# Patient Record
Sex: Female | Born: 1975 | Race: White | Hispanic: No | Marital: Single | Smoking: Former smoker
Health system: Southern US, Community
[De-identification: ages and names within clinical notes are randomized; demographics above are authoritative.]

## PROBLEM LIST (undated history)

## (undated) DIAGNOSIS — F32A Depression, unspecified: Secondary | ICD-10-CM

## (undated) DIAGNOSIS — F419 Anxiety disorder, unspecified: Secondary | ICD-10-CM

## (undated) DIAGNOSIS — E7800 Pure hypercholesterolemia, unspecified: Secondary | ICD-10-CM

## (undated) DIAGNOSIS — F988 Other specified behavioral and emotional disorders with onset usually occurring in childhood and adolescence: Secondary | ICD-10-CM

## (undated) DIAGNOSIS — F329 Major depressive disorder, single episode, unspecified: Secondary | ICD-10-CM

## (undated) DIAGNOSIS — J329 Chronic sinusitis, unspecified: Secondary | ICD-10-CM

## (undated) HISTORY — PX: SINUS SURGERY WITH INSTATRAK: SHX5215

## (undated) HISTORY — PX: HIP SURGERY: SHX245

---

## 2004-12-24 ENCOUNTER — Ambulatory Visit (HOSPITAL_COMMUNITY): Admission: RE | Admit: 2004-12-24 | Discharge: 2004-12-24 | Payer: Self-pay | Admitting: Obstetrics and Gynecology

## 2013-06-07 ENCOUNTER — Encounter: Payer: Self-pay | Admitting: Family Medicine

## 2016-06-10 ENCOUNTER — Encounter (HOSPITAL_BASED_OUTPATIENT_CLINIC_OR_DEPARTMENT_OTHER): Payer: Self-pay

## 2016-06-10 ENCOUNTER — Emergency Department (HOSPITAL_BASED_OUTPATIENT_CLINIC_OR_DEPARTMENT_OTHER): Payer: Worker's Compensation

## 2016-06-10 ENCOUNTER — Emergency Department (HOSPITAL_BASED_OUTPATIENT_CLINIC_OR_DEPARTMENT_OTHER)
Admission: EM | Admit: 2016-06-10 | Discharge: 2016-06-10 | Disposition: A | Discharge status: Home / Self Care | Payer: Worker's Compensation | Attending: Emergency Medicine | Admitting: Emergency Medicine

## 2016-06-10 DIAGNOSIS — S4992XA Unspecified injury of left shoulder and upper arm, initial encounter: Secondary | ICD-10-CM | POA: Diagnosis not present

## 2016-06-10 DIAGNOSIS — Y939 Activity, unspecified: Secondary | ICD-10-CM | POA: Insufficient documentation

## 2016-06-10 DIAGNOSIS — Y92410 Unspecified street and highway as the place of occurrence of the external cause: Secondary | ICD-10-CM | POA: Diagnosis not present

## 2016-06-10 DIAGNOSIS — F909 Attention-deficit hyperactivity disorder, unspecified type: Secondary | ICD-10-CM | POA: Diagnosis not present

## 2016-06-10 DIAGNOSIS — F17200 Nicotine dependence, unspecified, uncomplicated: Secondary | ICD-10-CM | POA: Diagnosis not present

## 2016-06-10 DIAGNOSIS — Y999 Unspecified external cause status: Secondary | ICD-10-CM | POA: Insufficient documentation

## 2016-06-10 DIAGNOSIS — S39012A Strain of muscle, fascia and tendon of lower back, initial encounter: Secondary | ICD-10-CM | POA: Diagnosis not present

## 2016-06-10 DIAGNOSIS — Z79899 Other long term (current) drug therapy: Secondary | ICD-10-CM | POA: Diagnosis not present

## 2016-06-10 DIAGNOSIS — S3992XA Unspecified injury of lower back, initial encounter: Secondary | ICD-10-CM | POA: Diagnosis present

## 2016-06-10 HISTORY — DX: Chronic sinusitis, unspecified: J32.9

## 2016-06-10 HISTORY — DX: Major depressive disorder, single episode, unspecified: F32.9

## 2016-06-10 HISTORY — DX: Other specified behavioral and emotional disorders with onset usually occurring in childhood and adolescence: F98.8

## 2016-06-10 HISTORY — DX: Anxiety disorder, unspecified: F41.9

## 2016-06-10 HISTORY — DX: Depression, unspecified: F32.A

## 2016-06-10 MED ORDER — IBUPROFEN 800 MG PO TABS: 800.0000 mg | ORAL_TABLET | Freq: Three times a day (TID) | ORAL | 0 refills | Status: AC | PRN
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
## 2016-06-10 NOTE — ED Notes (Signed)
Returned from xray
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
## 2016-06-10 NOTE — ED Notes (Signed)
PA at bedside.

## 2016-06-10 NOTE — ED Notes (Signed)
Patient transported to X-ray 

## 2016-06-10 NOTE — Discharge Instructions (Signed)
Return here as needed. Follow up with the orthopedist provided. Ice and heat to the areas that are sore.

## 2017-07-13 ENCOUNTER — Encounter (HOSPITAL_COMMUNITY): Payer: Self-pay | Admitting: Emergency Medicine

## 2017-07-13 ENCOUNTER — Emergency Department (HOSPITAL_COMMUNITY): Payer: 59

## 2017-07-13 ENCOUNTER — Other Ambulatory Visit: Payer: Self-pay

## 2017-07-13 ENCOUNTER — Emergency Department (HOSPITAL_COMMUNITY)
Admission: EM | Admit: 2017-07-13 | Discharge: 2017-07-13 | Disposition: A | Discharge status: Home / Self Care | Payer: 59 | Attending: Emergency Medicine | Admitting: Emergency Medicine

## 2017-07-13 DIAGNOSIS — Z79899 Other long term (current) drug therapy: Secondary | ICD-10-CM | POA: Insufficient documentation

## 2017-07-13 DIAGNOSIS — R079 Chest pain, unspecified: Secondary | ICD-10-CM | POA: Insufficient documentation

## 2017-07-13 DIAGNOSIS — R945 Abnormal results of liver function studies: Secondary | ICD-10-CM | POA: Diagnosis not present

## 2017-07-13 DIAGNOSIS — R7989 Other specified abnormal findings of blood chemistry: Secondary | ICD-10-CM

## 2017-07-13 DIAGNOSIS — Z87891 Personal history of nicotine dependence: Secondary | ICD-10-CM | POA: Insufficient documentation

## 2017-07-13 LAB — COMPREHENSIVE METABOLIC PANEL
ALT: 145 U/L — ABNORMAL HIGH (ref 14–54)
ANION GAP: 7 (ref 5–15)
AST: 279 U/L — AB (ref 15–41)
Albumin: 3.6 g/dL (ref 3.5–5.0)
Alkaline Phosphatase: 91 U/L (ref 38–126)
BILIRUBIN TOTAL: 0.5 mg/dL (ref 0.3–1.2)
BUN: 13 mg/dL (ref 6–20)
CHLORIDE: 101 mmol/L (ref 101–111)
CO2: 25 mmol/L (ref 22–32)
Calcium: 9 mg/dL (ref 8.9–10.3)
Creatinine, Ser: 0.95 mg/dL (ref 0.44–1.00)
Glucose, Bld: 121 mg/dL — ABNORMAL HIGH (ref 65–99)
POTASSIUM: 4.2 mmol/L (ref 3.5–5.1)
Sodium: 133 mmol/L — ABNORMAL LOW (ref 135–145)
TOTAL PROTEIN: 6.7 g/dL (ref 6.5–8.1)

## 2017-07-13 LAB — D-DIMER, QUANTITATIVE (NOT AT ARMC): D DIMER QUANT: 0.47 ug{FEU}/mL (ref 0.00–0.50)

## 2017-07-13 LAB — LIPASE, BLOOD: LIPASE: 37 U/L (ref 11–51)

## 2017-07-13 LAB — CBC
HCT: 39.2 % (ref 36.0–46.0)
Hemoglobin: 13.3 g/dL (ref 12.0–15.0)
MCH: 30.4 pg (ref 26.0–34.0)
MCHC: 33.9 g/dL (ref 30.0–36.0)
MCV: 89.5 fL (ref 78.0–100.0)
Platelets: 225 10*3/uL (ref 150–400)
RBC: 4.38 MIL/uL (ref 3.87–5.11)
RDW: 12.6 % (ref 11.5–15.5)
WBC: 13.9 10*3/uL — ABNORMAL HIGH (ref 4.0–10.5)

## 2017-07-13 LAB — I-STAT TROPONIN, ED: Troponin i, poc: 0 ng/mL (ref 0.00–0.08)

## 2017-07-13 MED ORDER — PANTOPRAZOLE SODIUM 20 MG PO TBEC: 20.0000 mg | DELAYED_RELEASE_TABLET | Freq: Every day | ORAL | 0 refills | Status: DC

## 2017-07-13 MED ORDER — SODIUM CHLORIDE 0.9 % IV BOLUS (SEPSIS)
1000.0000 mL | Freq: Once | INTRAVENOUS | Status: AC
  Administered 2017-07-13: 1000 mL via INTRAVENOUS

## 2017-07-13 MED ORDER — KETOROLAC TROMETHAMINE 30 MG/ML IJ SOLN
30.0000 mg | Freq: Once | INTRAMUSCULAR | Status: AC
  Administered 2017-07-13: 30 mg via INTRAVENOUS
  Filled 2017-07-13: qty 1

## 2017-07-13 NOTE — ED Triage Notes (Addendum)
Per EMS:  Pt here with c/o of sudden onset CP 8/10.  Pressure radiating to back and abdomen. EMS administered 324 ASA, 4mg  Zofran, and 2-Nitro.  Pain currently is 4/10.  Pt also complains of nausea and pain when trying to take a deep breath.  PTA vitals: 154/98 BP, HR 108,  SP02 98% RA.

## 2017-07-13 NOTE — ED Provider Notes (Signed)
[facility] Provider Note   CSN: [id number] Arrival date & time:        History   Chief Complaint Chief Complaint  Patient presents with  . Chest Pain    HPI Charlotte Frost is a 41 y.o. female.  HPI  41 y.o. female with a hx of Anxiety, Depression, presents to the Emergency Department today due to chest pain. This occurred around 1830. 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 States pain was 10/10 and lasted 20 minutes approximately. Notes residual dullness, but not near as pad. Rates pain 2/10. Pt was given 324 ASA, Zofran and 2 Nitro PTA. Notes some relief with nitro, but pain returned right away. Notes diaphoresis as well nausea. No emesis. No shortness of breath. Notes abdominal discomfort in epigastric region. No headaches. No fever. No cough/congestion. No hx ACS. No FH. No hx DVT/PE. No recent travel. No recent surgeries. No other symptoms noted    Past Medical History:  Diagnosis Date  . ADD (attention deficit disorder)   . Anxiety   . Depression   . Sinusitis, chronic     There are no active problems to display for this patient.   Past Surgical History:  Procedure Laterality Date  . HIP SURGERY    . SINUS SURGERY WITH INSTATRAK      OB History    No data available       Home Medications    Prior to Admission medications   Medication Sig Start Date End Date Taking? Authorizing Provider  Cetirizine HCl (ZYRTEC PO) Take by mouth.    [provider]  ClonazePAM (KLONOPIN PO) Take by mouth.    [provider]  fluticasone (FLONASE) 50 MCG/ACT nasal spray Place into both nostrils daily.    [provider]  ibuprofen (ADVIL,MOTRIN) 800 MG tablet Take 1 tablet (800 mg total) by mouth every 8 (eight) hours as needed. 06/10/16   Lawyer, Cristal Deerhristopher, PA-C  Lisdexamfetamine Dimesylate (VYVANSE PO) Take  by mouth.    [provider]  Montelukast Sodium (SINGULAIR PO) Take by mouth.    [provider]  Multiple Vitamin (MULTIVITAMIN) tablet Take 1 tablet by mouth daily.    [provider]  Pseudoephedrine HCl (SUDAFED PO) Take by mouth.    [provider]  Sertraline HCl (ZOLOFT PO) Take by mouth.    [provider]    Family History History reviewed. No pertinent family history.  Social History Social History   Tobacco Use  . Smoking status: Former Smoker    Last attempt to quit: 07/13/2004    Years since quitting: 13.0  . Smokeless tobacco: Never Used  Substance Use Topics  . Alcohol use: Yes    Comment: weekly  . Drug use: No     Allergies   Adhesive [tape]; Neosporin [neomycin-bacitracin zn-polymyx]; and Penicillins   Review of Systems Review of Systems ROS reviewed and all are negative for acute change except as noted in the HPI.  Physical Exam Updated Vital Signs BP 128/79   Pulse (!) 104   Resp 13   LMP 06/12/2017 (Approximate)   SpO2 100%   Physical Exam  Constitutional: She is oriented to person, place, and time. She appears well-developed and well-nourished. No distress.  HENT:  Head: Normocephalic and atraumatic.  Right Ear: Tympanic membrane, external ear and ear canal normal.  Left Ear: Tympanic membrane, external  ear and ear canal normal.  Nose: Nose normal.  Mouth/Throat: Uvula is midline, oropharynx is clear and moist and mucous membranes are normal. No trismus in the jaw. No oropharyngeal exudate, posterior oropharyngeal erythema or tonsillar abscesses.  Eyes: EOM are normal. Pupils are equal, round, and reactive to light.  Neck: Normal range of motion. Neck supple. No tracheal deviation present.  Cardiovascular: Normal rate, regular rhythm, S1 normal, S2 normal, normal heart sounds, intact distal pulses and normal pulses.  Pulmonary/Chest: Effort normal and breath sounds normal. No respiratory distress.  She has no decreased breath sounds. She has no wheezes. She has no rhonchi. She has no rales.  Abdominal: Normal appearance and bowel sounds are normal. There is tenderness in the epigastric area. There is no rigidity, no rebound, no guarding, no CVA tenderness, no tenderness at McBurney's point and negative Murphy's sign.  Musculoskeletal: Normal range of motion.  Neurological: She is alert and oriented to person, place, and time.  Skin: Skin is warm and dry.  Psychiatric: She has a normal mood and affect. Her speech is normal and behavior is normal. Thought content normal.  Nursing note and vitals reviewed.   ED Treatments / Results  Labs (all labs ordered are listed, but only abnormal results are displayed) Labs Reviewed  COMPREHENSIVE METABOLIC PANEL - Abnormal; Notable for the following components:      Result Value   Sodium 133 (*)    Glucose, Bld 121 (*)    AST 279 (*)    ALT 145 (*)    All other components within normal limits  CBC - Abnormal; Notable for the following components:   WBC 13.9 (*)    All other components within normal limits  LIPASE, BLOOD  D-DIMER, QUANTITATIVE (NOT AT [facility])  HEPATITIS PANEL, ACUTE  I-STAT TROPONIN, ED    EKG  EKG Interpretation  Date/Time:  Tuesday July 13 2017 19:18:00 EST Ventricular Rate:  102 PR Interval:    QRS Duration: 94 QT Interval:  343 QTC Calculation: 447 R Axis:   52 Text Interpretation:  Sinus tachycardia Low voltage, precordial leads n ischemic appearance. no old comp Confirmed by Arby BarrettePfeiffer, Marcy [phone] on 07/13/2017 9:41:04 PM       Radiology Dg Chest 2 View  Result Date: 07/13/2017 CLINICAL DATA:  Sudden onset of chest pain. EXAM: CHEST  2 VIEW COMPARISON:  05/22/2017 FINDINGS: Both lungs are clear. Heart and mediastinum are within normal limits. Negative for a pneumothorax. Trachea is midline. Bone structures are intact. No large pleural effusions. IMPRESSION: No active cardiopulmonary disease.  Electronically Signed   By: Richarda OverlieAdam  Henn M.D.   On: 07/13/2017 21:11   [place] Abdomen Limited Ruq  Result Date: 07/13/2017 CLINICAL DATA:  Elevated LFTs. EXAM: ULTRASOUND ABDOMEN LIMITED RIGHT UPPER QUADRANT COMPARISON:  None. FINDINGS: Gallbladder: Gallbladder is not identified. Expected location of the gallbladder is obscured by bowel gas. Common bile duct: Diameter: 0.2 cm Liver: Liver is not well visualized on this examination. Echogenicity is within normal limits. No significant intrahepatic biliary dilatation. Portal vein is patent on color Doppler imaging with normal direction of blood flow towards the liver. IMPRESSION: Gallbladder was not identified on this examination and may be obscured by bowel gas. Consider a follow-up study when the patient has been NPO. No gross abnormality to the liver as described. No biliary dilatation. Electronically Signed   By: Richarda OverlieAdam  Henn M.D.   On: 07/13/2017 23:04    Procedures Procedures (including critical care time)  Medications Ordered in ED  Medications  sodium chloride 0.9 % bolus 1,000 mL (0 mLs Intravenous Stopped 07/13/17 2134)  ketorolac (TORADOL) 30 MG/ML injection 30 mg (30 mg Intravenous Given 07/13/17 2018)     Initial Impression / Assessment and Plan / ED Course  I have reviewed the triage vital signs and the nursing notes.  Pertinent labs & imaging results that were available during my care of the patient were reviewed by me and considered in my medical decision making (see chart for details).  Final Clinical Impressions(s) / ED Diagnoses  {I have reviewed and evaluated the relevant laboratory values. {I have reviewed and evaluated the relevant imaging studies. {I have interpreted the relevant EKG. {I have reviewed the relevant previous healthcare records. {I have reviewed EMS Documentation. {I obtained HPI from historian.   ED Course:  Assessment: Pt is a 41 y.o. female with a hx of Anxiety, Depression, presents to the Emergency Department  today due to chest pain. This occurred around 1830. 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 States pain was 10/10 and lasted 20 minutes approximately. Notes residual dullness, but not near as pad. Rates pain 2/10. Pt was given 324 ASA, Zofran and 2 Nitro PTA. Notes some relief with nitro, but pain returned right away. Notes diaphoresis as well nausea. No emesis. No shortness of breath. Notes abdominal discomfort in epigastric region. No headaches. No fever. No cough/congestion. No hx ACS. No FH. No hx DVT/PE. No recent travel. No recent surgeries. Patient is to be discharged with recommendation to follow up with PCP in regards to today's hospital visit. Chest pain is not likely of cardiac or pulmonary etiology d/t presentation, perc negative, VSS, no tracheal deviation, no JVD or new murmur, RRR, breath sounds equal bilaterally, EKG without acute abnormalities, negative troponin, and negative CXR. D dimer negative. Heart Score 2. CMP shows elevated LFTs. Denies RUQ pain. Bili normal. RUQ US unremarkable. Gallbladder was not well visualized due to bowel gas, but pt without pain in RUQ. Able to tolerate PO. No pain with palpation. Doubt cholecystitis. No CBD dilation. Hepatitis Panel ordered. Pt has been advised to the ED is CP becomes exertional, associated with diaphoresis or nausea, radiates to left jaw/arm, worsens or becomes concerning in any way. Given follow up to GI for hepatitis panel results. Pt appears reliable for follow up and is agreeable to discharge. Patient is in no acute distress. Vital Signs are stable. Patient is able to ambulate. Patient able to tolerate PO.   Disposition/Plan:  DC Home Additional Verbal discharge instructions given and discussed with patient.  Pt Instructed to f/u with PCP in the next week for evaluation and treatment of symptoms. Return precautions given Pt acknowledges and  agrees with plan  Supervising Physician Arby Barrette, MD  Final diagnoses:  Chest pain, unspecified type  LFT elevation    ED Discharge Orders    None       Audry Pili, PA-C 07/13/17 2310    Arby Barrette, MD 07/14/17 [phone]

## 2017-07-13 NOTE — Discharge Instructions (Addendum)
Please read and follow all provided instructions.  Your diagnoses today include:  1. Chest pain, unspecified type   2. LFT elevation     Tests performed today include: An EKG of your heart A chest x-ray Cardiac enzymes - a blood test for heart muscle damage Blood counts and electrolytes Vital signs. See below for your results today.   Medications prescribed:   Take any prescribed medications only as directed.  Follow-up instructions: Please follow-up with your primary care provider as soon as you can for further evaluation of your symptoms.   Return instructions:  SEEK IMMEDIATE MEDICAL ATTENTION IF: You have severe chest pain, especially if the pain is crushing or pressure-like and spreads to the arms, back, neck, or jaw, or if you have sweating, nausea (feeling sick to your stomach), or shortness of breath. THIS IS AN EMERGENCY. Don't wait to see if the pain will go away. Get medical help at once. Call 911 or 0 (operator). DO NOT drive yourself to the hospital.  Your chest pain gets worse and does not go away with rest.  You have an attack of chest pain lasting longer than usual, despite rest and treatment with the medications your caregiver has prescribed.  You wake from sleep with chest pain or shortness of breath. You feel dizzy or faint. You have chest pain not typical of your usual pain for which you originally saw your caregiver.  You have any other emergent concerns regarding your health.  Additional Information: Chest pain comes from many different causes. Your caregiver has diagnosed you as having chest pain that is not specific for one problem, but does not require admission.  You are at low risk for an acute heart condition or other serious illness.   Your vital signs today were: BP 116/69    Pulse (!) 103    Resp 18    LMP 06/12/2017 (Approximate)    SpO2 99%  If your blood pressure (BP) was elevated above 135/85 this visit, please have this repeated by your doctor  within one month. --------------

## 2017-07-15 LAB — HEPATITIS PANEL, ACUTE
HEP A IGM: NEGATIVE
HEP B C IGM: NEGATIVE
Hepatitis B Surface Ag: NEGATIVE

## 2018-06-30 IMAGING — US US ABDOMEN LIMITED
1 series · 14 of 22 positions shown · non-contrast
Comparison: None.

CLINICAL DATA: Elevated LFTs.

EXAM:
ULTRASOUND ABDOMEN LIMITED RIGHT UPPER QUADRANT

[Series 1: us abdomen limited · 0.26mm/px · 14 of 22 slices shown]
[im 1/22]
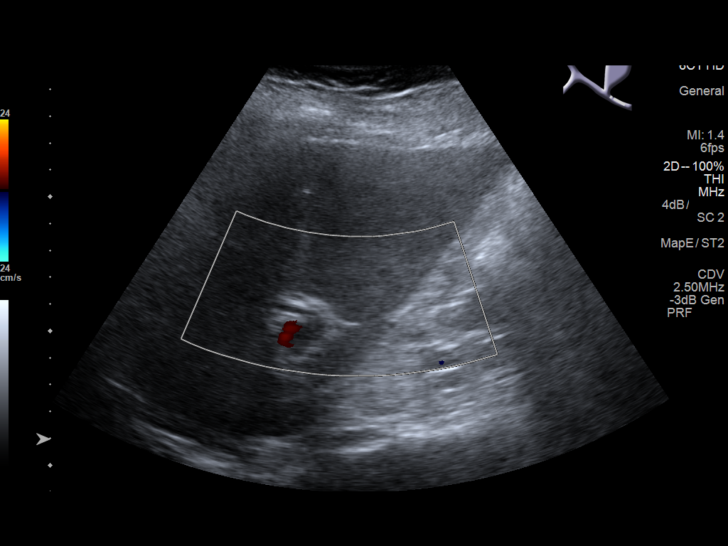
[im 3/22]
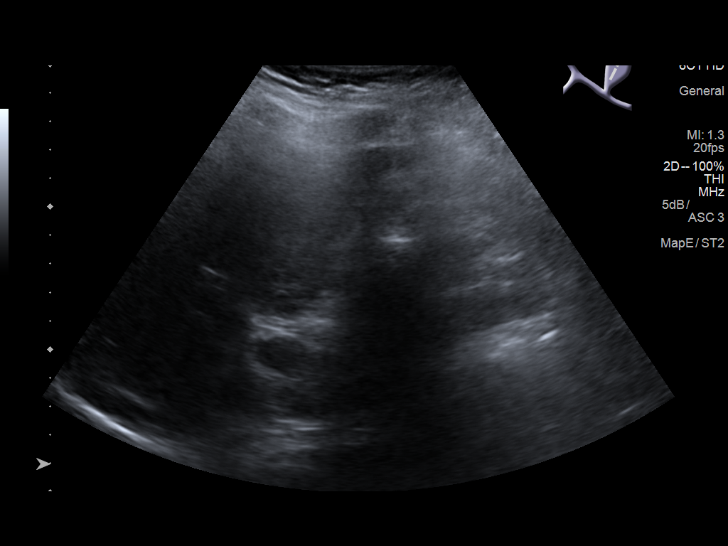
[im 4/22]
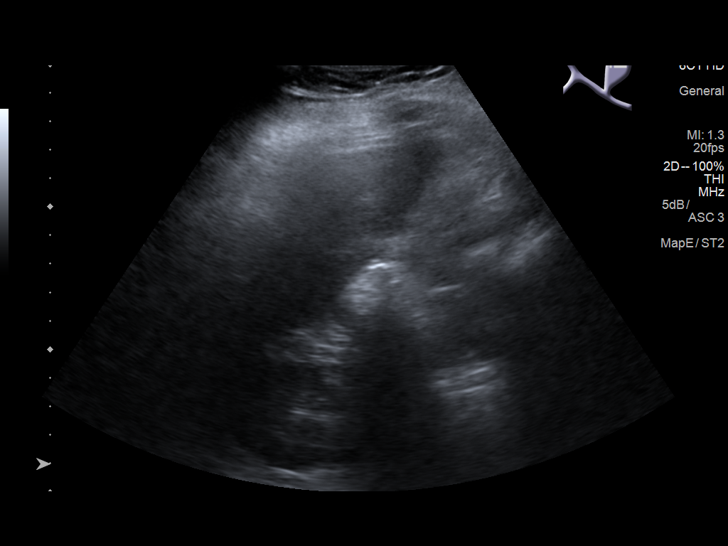
[im 6/22]
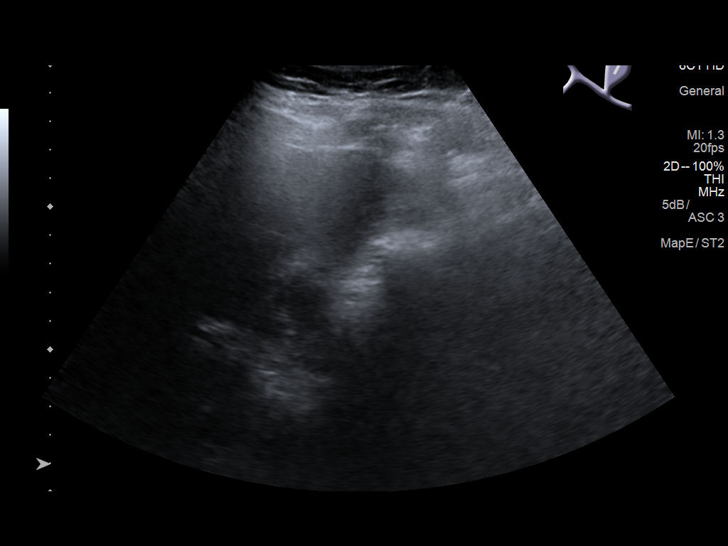
[im 8/22]
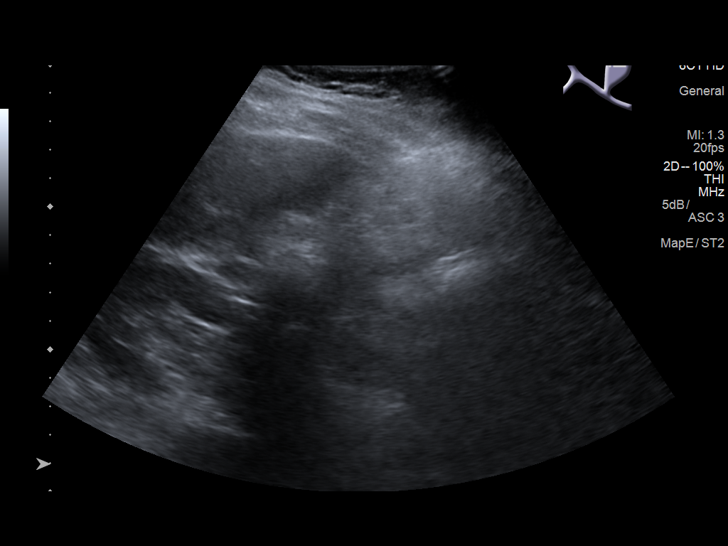
[im 9/22]
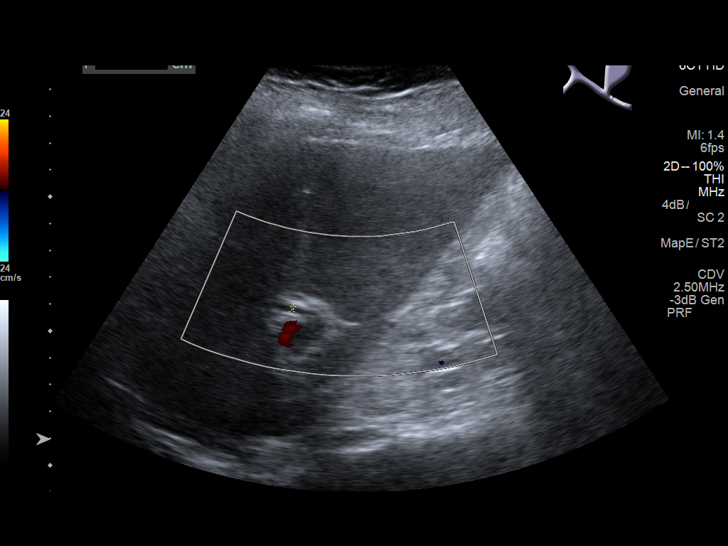
[im 11/22]
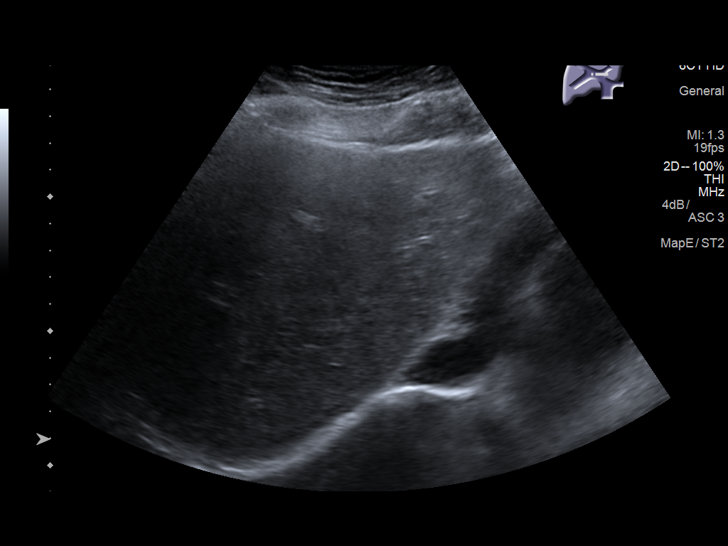
[im 12/22]
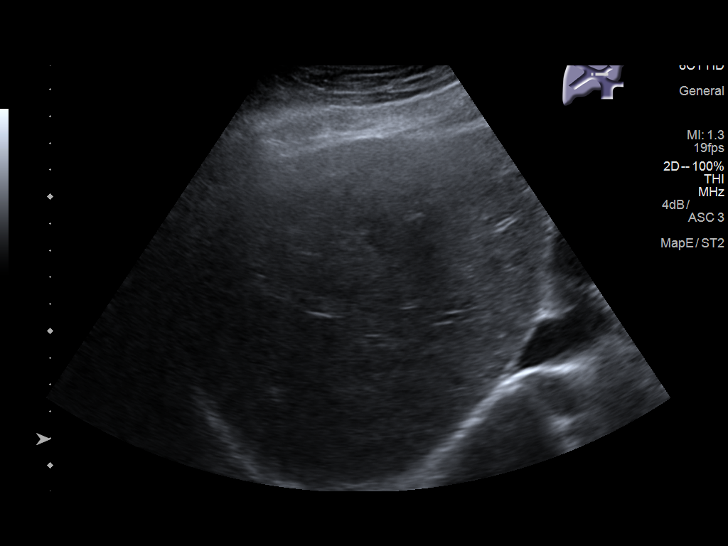
[im 14/22]
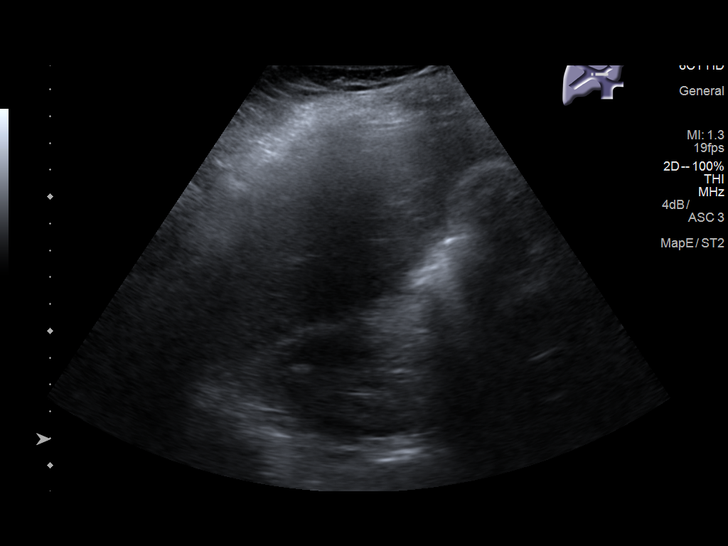
[im 15/22]
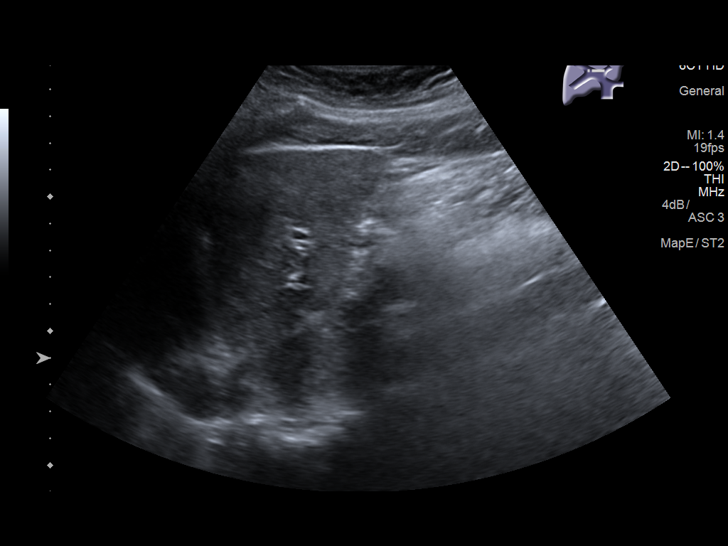
[im 17/22]
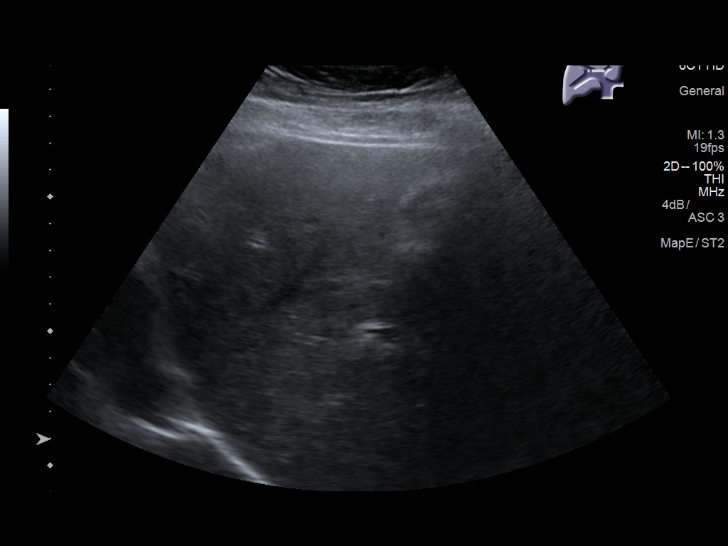
[im 19/22]
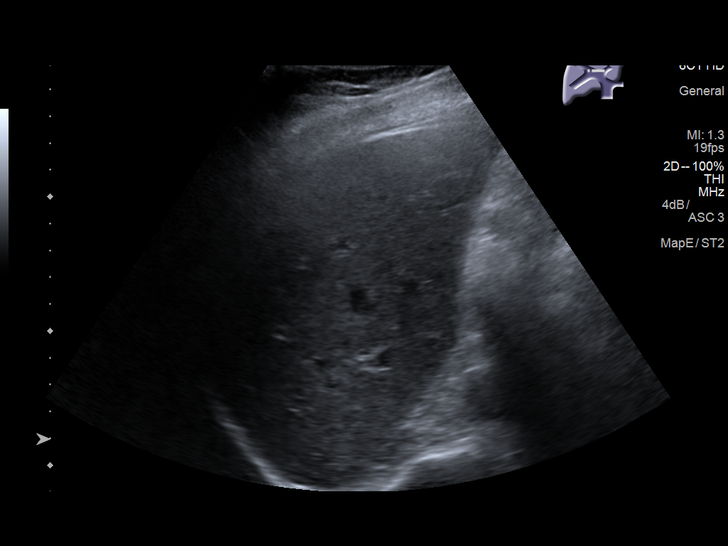
[im 20/22]
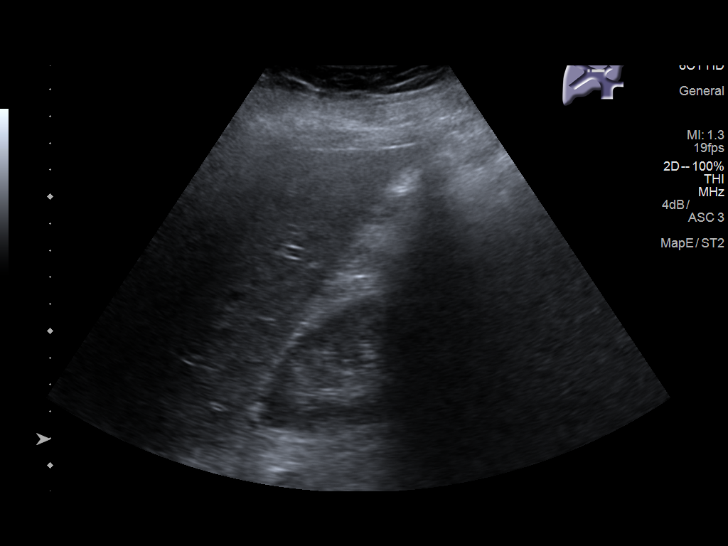
[im 22/22]
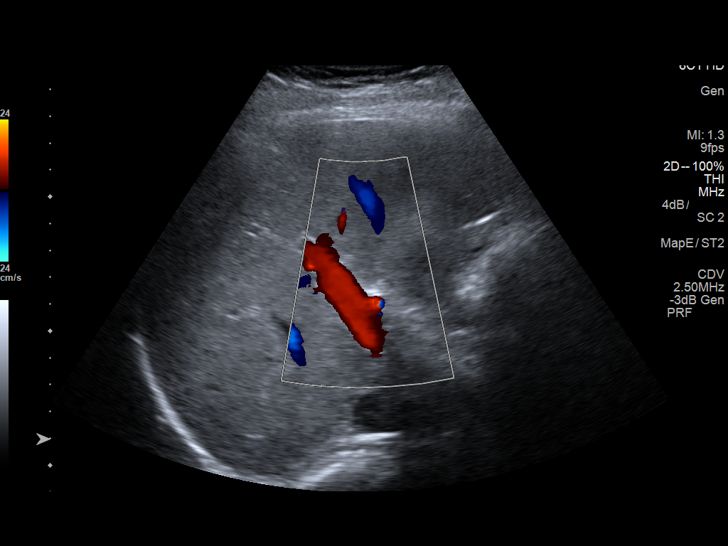

[14 of 22 positions shown; findings below may reference images not displayed]

FINDINGS: Gallbladder:

Gallbladder is not identified. Expected location of the gallbladder
is obscured by bowel gas.

Common bile duct:

Diameter: 0.2 cm

Liver:

Liver is not well visualized on this examination. Echogenicity is
within normal limits. No significant intrahepatic biliary
dilatation. Portal vein is patent on color Doppler imaging with
normal direction of blood flow towards the liver.
IMPRESSION: Gallbladder was not identified on this examination and may be
obscured by bowel gas. Consider a follow-up study when the patient
has been NPO.

No gross abnormality to the liver as described. No biliary
dilatation.

## 2022-06-10 NOTE — Progress Notes (Signed)
NEW PATIENT Date of Service/Encounter:  06/12/22 Referring provider: [facility] Primary care provider: Norm Salt, [place]  Subjective:  Charlotte Frost is a 46 y.o. female with a PMHx of depression presenting today for evaluation of chronic sinusitis. History obtained from: chart review and patient.   Chronic rhinitis: started as an adult Symptoms include: having significant drainage, facial pressure, congestion, which she feels progressively worsens  Occurs year-round Potential triggers: cat? Has 4 cats in the home. Treatments tried: cetirizine, flonase, singulair, saline spray, neil med sinus rinses;  sudafed seems most helpful, consistently taking sudafed and ibuprofen Previous allergy testing: yes-20 years ago, trees and other pollens were worse History of reflux/heartburn:  yes-previously on protonix, but has not needed since removing alcohol from diet Previous sinus surgery with Dr. Verne Spurr about 10 years ago, after this infections decreased significantly Symptoms are severe and causing her not to want to go outside due to fear of worsening symptoms and possible sinus infection. Not requiring antiboitics due to symptoms, does get prednisone 1-2 times per year  Adhesive tape: rash that irritates and mild  Penicillin allergy: rash in childhood, but several years ago did get prescribed something in the family and didn't have any reaction but doesn't remember which-possibly Augmentin.  She wonders if allergic to lime as this causes some nasal congestion, but no other symptoms.  Alcohol also causes nasal congestion. Gluten also she wonders about-no specific concerns.   Other allergy screening: Asthma: no Food allergy: no Hymenoptera allergy: no Eczema:no History of recurrent infections suggestive of immunodeficency: no Vaccinations are up to date.   Past Medical History: Past Medical History:  Diagnosis Date   ADD (attention deficit disorder)     Anxiety    Depression    Sinusitis, chronic    Medication List:  Current Outpatient Medications  Medication Sig Dispense Refill   Cetirizine HCl (ZYRTEC PO) Take by mouth.     ClonazePAM (KLONOPIN PO) Take by mouth.     EPINEPHrine (EPIPEN 2-PAK) 0.3 mg/0.3 mL IJ SOAJ injection Inject 0.3 mg into the muscle as needed for anaphylaxis. Bring to your allergy injection appointments. 2 each 2   ibuprofen (ADVIL,MOTRIN) 800 MG tablet Take 1 tablet (800 mg total) by mouth every 8 (eight) hours as needed. 21 tablet 0   Lisdexamfetamine Dimesylate (VYVANSE PO) Take by mouth.     Magnesium 500 MG TABS Take by mouth.     montelukast (SINGULAIR) 10 MG tablet Take 1 tablet (10 mg total) by mouth at bedtime. 30 tablet 3   Multiple Vitamin (MULTIVITAMIN) tablet Take 1 tablet by mouth daily.     nortriptyline (PAMELOR) 50 MG capsule Take 100 mg by mouth at bedtime.     Olopatadine-Mometasone (RYALTRIS) X543819 MCG/ACT SUSP Place 2 each into the nose in the morning and at bedtime. 29 g 3   Omega-3 Fatty Acids (FISH OIL) 1000 MG CPDR Take by mouth.     Probiotic Product (CULTURELLE PROBIOTICS) CHEW Chew by mouth.     Pseudoephedrine HCl (SUDAFED PO) Take by mouth.     rosuvastatin (CRESTOR) 20 MG tablet Take 20 mg by mouth daily.     sertraline (ZOLOFT) 100 MG tablet Take 100 mg by mouth 2 (two) times daily.     topiramate (TOPAMAX) 50 MG tablet Take 50 mg by mouth 2 (two) times daily.     Turmeric 500 MG CAPS Take by mouth.     clonazePAM (KLONOPIN) 1 MG tablet Take by mouth.  cloNIDine (CATAPRES) 0.1 MG tablet Take 0.2 mg by mouth 2 (two) times daily.     [START ON 06/25/2022] methylphenidate 36 MG PO CR tablet Take 1 a day     orphenadrine (NORFLEX) 100 MG tablet TAKE 1 TABLET BY MOUTH TWICE DAILY FOR 15 DAYS     No current facility-administered medications for this visit.   Known Allergies:  Allergies  Allergen Reactions   Adhesive [Tape]     Mild rash-contact dermatitis   Neosporin  [Neomycin-Bacitracin Zn-Polymyx]    Penicillins     Rash in childhood   Past Surgical History: Past Surgical History:  Procedure Laterality Date   HIP SURGERY     SINUS SURGERY WITH INSTATRAK     Family History: History reviewed. No pertinent family history. Social History: Charlotte Frost lives in a house built 30 years ago, no water damage, carpet in bedroom, gas heating central AC, 4 cats and 1 tarantula, no cockroaches, not using dust mite protection on the bedding or pillows, vapes since 2 years ago and in the process of quitting, works as a Audiological scientist for 10 years, no exposure to fumes chemicals or dust with hobbies but is exposed at her job.  + HEPA filter in the home.  [place]   ROS:  All other systems negative except as noted per HPI.  Objective:  Blood pressure 122/82, pulse 87, temperature 98.1 F (36.7 C), temperature source Temporal, resp. rate 18, height 5\' 6"  (1.676 m), weight 256 lb 8 oz (116.3 kg), SpO2 98 %. Body mass index is 41.4 kg/m. Physical Exam:  General Appearance:  Alert, cooperative, no distress, appears stated age  Head:  Normocephalic, without obvious abnormality, atraumatic  Eyes:  Conjunctiva clear, EOM's intact  Nose: Nares normal, hypertrophic turbinates, normal mucosa, no visible anterior polyps, and septum midline  Throat: Lips, tongue normal; teeth and gums normal, normal posterior oropharynx  Neck: Supple, symmetrical  Lungs:   clear to auscultation bilaterally, Respirations unlabored, no coughing  Heart:  regular rate and rhythm and no murmur, Appears well perfused  Extremities: No edema  Skin: Skin color, texture, turgor normal, no rashes or lesions on visualized portions of skin  Neurologic: No gross deficits     Diagnostics: Skin Testing: Environmental allergy panel and select foods.  Adequate controls. Results discussed with patient/family.  Airborne Adult Perc - 06/12/22 1028     Time Antigen Placed  0100    Allergen Manufacturer Lavella Hammock    Location Back    Number of Test 59    1. Control-Buffer 50% Glycerol Negative    2. Control-Histamine 1 mg/ml 3+    3. Albumin saline Negative    4. [place] Negative    5. [place] Negative    6. Johnson Negative    7. Bingen Blue Negative    8. Meadow Fescue Negative    9. Perennial Rye Negative    10. Sweet Vernal Negative    11. Timothy Negative    12. Cocklebur Negative    13. Burweed Marshelder Negative    14. Ragweed, short Negative    15. Ragweed, Giant Negative    16. Plantain,  English Negative    17. Lamb's Quarters Negative    18. Sheep Sorrell Negative    19. Rough Pigweed Negative    20. Marsh Elder, Rough Negative    21. Mugwort, Common Negative    22. Ash mix Negative    23. Birch mix Negative    24. Steva Colder  American Negative    25. Box, Elder Negative    26. Cedar, red Negative    27. Cottonwood, Guinea[place]Bissau Negative    28. Elm mix Negative    29. Hickory Negative    30. Maple mix Negative    31. Oak, [place] mix Negative    32. Pecan Pollen Negative    33. Pine mix Negative    34. Sycamore Eastern Negative    35. Walnut, Black Pollen Negative    36. Alternaria alternata Negative    37. Cladosporium Herbarum Negative    38. Aspergillus mix Negative    39. Penicillium mix Negative    40. Bipolaris sorokiniana (Helminthosporium) Negative    41. Drechslera spicifera (Curvularia) Negative    42. Mucor plumbeus Negative    43. Fusarium moniliforme Negative    44. Aureobasidium pullulans (pullulara) Negative    45. Rhizopus oryzae Negative    46. Botrytis cinera Negative    47. Epicoccum nigrum Negative    48. Phoma betae Negative    49. Candida Albicans Negative    50. Trichophyton mentagrophytes Negative    51. Mite, D Farinae  5,000 AU/ml Negative    52. Mite, D Pteronyssinus  5,000 AU/ml Negative    53. Cat Hair 10,000 BAU/ml Negative    54.  Dog Epithelia Negative    55. Mixed Feathers Negative    56. Horse  Epithelia Negative    57. Cockroach, German Negative    58. Mouse Negative    59. Tobacco Leaf Negative             Intradermal - 06/12/22 1025     Time Antigen Placed 1000    Allergen Manufacturer Other    Location Arm    Number of Test 14    Intradermal Select    Control Negative    [place] Negative    Johnson Negative    7 Grass Negative    Ragweed mix Negative    Weed mix Negative    Tree mix Negative    Mold 1 3+    Mold 2 3+    Mold 3 Negative    Mold 4 Negative    Cat 3+    Dog 2+    Cockroach Negative             Food Adult Perc - 06/12/22 0900     Time Antigen Placed 0930    Allergen Manufacturer Waynette Buttery    Location Back    Number of allergen test 3    3. Wheat Negative    30. Barley Negative    32. Rye  Negative             Allergy testing results were read and interpreted by myself, documented by clinical staff.  Assessment and Plan  Chronic Rhinitis -perennial allergic: - allergy testing today: skin testing was positive to dust mites, and intradermals positive to indoor/outdoor molds, cat, and dog - allergen avoidance as below - consider allergy shots as long term control of your symptoms by teaching your immune system to be more tolerant of your allergy triggers - Continue Singulair (Montelukast) 10mg  nightly. - Continue over the counter antihistamine daily or daily as needed.   -Your options include Zyrtec (Cetirizine) 10mg , Claritin (Loratadine) 10mg , Allegra (Fexofenadine) 180mg , or Xyzal (Levocetirinze) 5mg   For nasal regimen:  First nasal saline rinses (neil med using distilled water only) twice daily followed by:  - Start Ryaltris 2 sprays twice daily   Contact dermatitis  to adhesive tape: - avoidance  H/O Penicillin allergy:low -risk - please schedule follow-up appt  for graded oral challenge to amoxicillin - please refrain from taking any antihistamines at least 3 days prior to this appointment  - around 80% of individuals  outgrow this allergy in ~ 10 years and carrying it as a diagnosis can prevent you from getting proper therapy if needed  Follow-up in 3 months, sooner if needed.   This note in its entirety was forwarded to the Provider who requested this consultation.  Thank you for your kind referral. I appreciate the opportunity to take part in Charlotte Frost's care. Please do not hesitate to contact me with questions.  Sincerely,  Tonny Bollman, MD [facility]

## 2022-06-12 ENCOUNTER — Ambulatory Visit (INDEPENDENT_AMBULATORY_CARE_PROVIDER_SITE_OTHER): Payer: Managed Care, Other (non HMO) | Admitting: Internal Medicine

## 2022-06-12 ENCOUNTER — Encounter: Payer: Self-pay | Admitting: Internal Medicine

## 2022-06-12 VITALS — BP 122/82 | HR 87 | Temp 98.1°F | Resp 18 | Ht 66.0 in | Wt 256.5 lb

## 2022-06-12 DIAGNOSIS — L231 Allergic contact dermatitis due to adhesives: Secondary | ICD-10-CM | POA: Diagnosis not present

## 2022-06-12 DIAGNOSIS — J3089 Other allergic rhinitis: Secondary | ICD-10-CM | POA: Diagnosis not present

## 2022-06-12 DIAGNOSIS — Z880 Allergy status to penicillin: Secondary | ICD-10-CM | POA: Diagnosis not present

## 2022-06-12 MED ORDER — EPINEPHRINE 0.3 MG/0.3ML IJ SOAJ: 0.3000 mg | INTRAMUSCULAR | 2 refills | Status: DC | PRN

## 2022-06-12 MED ORDER — MONTELUKAST SODIUM 10 MG PO TABS: 10.0000 mg | ORAL_TABLET | Freq: Every day | ORAL | 3 refills | Status: DC

## 2022-06-12 MED ORDER — RYALTRIS 665-25 MCG/ACT NA SUSP: 2.0000 | Freq: Two times a day (BID) | NASAL | 3 refills | Status: DC

## 2022-06-12 NOTE — Patient Instructions (Addendum)
Chronic Rhinitis -perennial allergic: - allergy testing today: skin testing was positive to dust mites, and intradermals positive to indoor/outdoor molds, cat, and dog - allergen avoidance as below - consider allergy shots as long term control of your symptoms by teaching your immune system to be more tolerant of your allergy triggers - Continue Singulair (Montelukast) 10mg  nightly. - Continue over the counter antihistamine daily or daily as needed.   -Your options include Zyrtec (Cetirizine) 10mg , Claritin (Loratadine) 10mg , Allegra (Fexofenadine) 180mg , or Xyzal (Levocetirinze) 5mg   For nasal regimen:  First nasal saline rinses (neil med using distilled water only) twice daily followed by:  - Start Ryaltris 2 sprays twice daily   Contact dermatitis to adhesive tape: - avoidance  H/O Penicillin allergy:low -risk - please schedule follow-up appt  for graded oral challenge to amoxicillin - please refrain from taking any antihistamines at least 3 days prior to this appointment  - around 80% of individuals outgrow this allergy in ~ 10 years and carrying it as a diagnosis can prevent you from getting proper therapy if needed  Food Intolerance:  - allergy testing negative to wheat, barley and rye - limit consumption of any foods that persistently give you unwanted symptoms  Follow-up in 3 months, sooner if needed.   Call back once you have spoken with insurance about allergy shots.  Let us know if you want to do traditional versus Rush immunotherapy.

## 2022-06-16 ENCOUNTER — Other Ambulatory Visit: Payer: Self-pay | Admitting: Internal Medicine

## 2022-06-16 DIAGNOSIS — J3089 Other allergic rhinitis: Secondary | ICD-10-CM

## 2022-06-16 NOTE — Progress Notes (Signed)
AIT Rx created.

## 2022-06-16 NOTE — Progress Notes (Signed)
Aeroallergen Immunotherapy   Ordering Provider: Dr. Sigurd Sos   Patient Details  Name: Charlotte Frost  MRN: [id number]  Date of Birth: 11-Oct-1975   Order 1 of 1   Vial Label: DM-C-D-M   0.2 ml (Volume)  1:20 Concentration -- Alternaria alternata  0.2 ml (Volume)  1:20 Concentration -- Cladosporium herbarum  0.2 ml (Volume)  1:10 Concentration -- Aspergillus mix  0.2 ml (Volume)  1:10 Concentration -- Penicillium mix  0.5 ml (Volume)  1:10 Concentration -- Cat Hair  0.5 ml (Volume)  1:10 Concentration -- Dog Epithelia  0.5 ml (Volume)   AU Concentration -- Mite Mix (DF 5,000 & DP 5,000)    2.3  ml Extract Subtotal  2.7  ml Diluent  5.0  ml Maintenance Total   Schedule:  RUSH  Silver Vial (1:1,000,000): RUSH  Blue Vial (1:100,000): RUSH  Yellow Vial (1:10,000): RUSH  Green Vial (1:1,000): Schedule B (6 doses)  Red Vial (1:100): Schedule A (10 doses)   Special Instructions: RUSH then B

## 2022-06-16 NOTE — Progress Notes (Signed)
VIALS EXP 06-17-23 

## 2022-07-13 ENCOUNTER — Other Ambulatory Visit: Payer: Self-pay

## 2022-07-13 MED ORDER — PREDNISONE 20 MG PO TABS: ORAL_TABLET | ORAL | 0 refills | Status: DC

## 2022-07-13 MED ORDER — FAMOTIDINE 20 MG PO TABS: 20.0000 mg | ORAL_TABLET | Freq: Two times a day (BID) | ORAL | 0 refills | Status: AC

## 2022-07-14 ENCOUNTER — Other Ambulatory Visit: Payer: Self-pay | Admitting: Internal Medicine

## 2022-07-15 NOTE — Progress Notes (Signed)
RAPID DESENSITIZATION Note  RE: Charlotte Frost MRN: [id number] DOB: Nov 13, 1975 Date of Office Visit: 07/17/2022  Subjective:  Patient presents today for rapid desensitization.  Interval History: Patient has not been ill, she has taken all premedications as per protocol.  Recent/Current History: Pulmonary disease: no Cardiac disease: no Respiratory infection: no Rash: no Itch: no Swelling: no Cough: no Shortness of breath: no Runny/stuffy nose: no Itchy eyes: no Beta-blocker use: no  Patient/guardian was informed of the procedure with verbalized understanding of the risk of anaphylaxis. Consent has been signed.   Medication List:  Current Outpatient Medications  Medication Sig Dispense Refill   Cetirizine HCl (ZYRTEC PO) Take by mouth.     ClonazePAM (KLONOPIN PO) Take by mouth.     clonazePAM (KLONOPIN) 1 MG tablet Take by mouth.     cloNIDine (CATAPRES) 0.1 MG tablet Take 0.2 mg by mouth 2 (two) times daily.     EPINEPHrine (EPIPEN 2-PAK) 0.3 mg/0.3 mL IJ SOAJ injection Inject 0.3 mg into the muscle as needed for anaphylaxis. Bring to your allergy injection appointments. 2 each 2   famotidine (PEPCID) 20 MG tablet Take 1 tablet (20 mg total) by mouth 2 (two) times daily. 4 tablet 0   ibuprofen (ADVIL,MOTRIN) 800 MG tablet Take 1 tablet (800 mg total) by mouth every 8 (eight) hours as needed. 21 tablet 0   Lisdexamfetamine Dimesylate (VYVANSE PO) Take by mouth.     Magnesium 500 MG TABS Take by mouth.     methylphenidate 36 MG PO CR tablet Take 1 a day     montelukast (SINGULAIR) 10 MG tablet Take 1 tablet (10 mg total) by mouth at bedtime. 30 tablet 3   Multiple Vitamin (MULTIVITAMIN) tablet Take 1 tablet by mouth daily.     nortriptyline (PAMELOR) 50 MG capsule Take 100 mg by mouth at bedtime.     Olopatadine-Mometasone (RYALTRIS) [id number] MCG/ACT SUSP Place 2 each into the nose in the morning and at bedtime. 29 g 3   Omega-3 Fatty Acids (FISH OIL) 1000 MG CPDR Take  by mouth.     orphenadrine (NORFLEX) 100 MG tablet TAKE 1 TABLET BY MOUTH TWICE DAILY FOR 15 DAYS     predniSONE (DELTASONE) 20 MG tablet Take 2 tablets Thursday morning and 2 tablets Friday morning before RUSH appt, please follow pre-meds instructions. 4 tablet 0   Probiotic Product (CULTURELLE PROBIOTICS) CHEW Chew by mouth.     Pseudoephedrine HCl (SUDAFED PO) Take by mouth.     rosuvastatin (CRESTOR) 20 MG tablet Take 20 mg by mouth daily.     sertraline (ZOLOFT) 100 MG tablet Take 100 mg by mouth 2 (two) times daily.     topiramate (TOPAMAX) 50 MG tablet Take 50 mg by mouth 2 (two) times daily.     Turmeric 500 MG CAPS Take by mouth.     No current facility-administered medications for this visit.   Allergies: Allergies  Allergen Reactions   Adhesive [Tape]     Mild rash-contact dermatitis   Neosporin [Neomycin-Bacitracin Zn-Polymyx]    Penicillins     Rash in childhood   I reviewed her past medical history, social history, family history, and environmental history and no significant changes have been reported from her previous visit.  ROS: Negative except as per HPI.  Objective: BP 118/78   Pulse 78   Temp 98.6 F (37 C) (Temporal)   Resp 17   SpO2 98%  There is no height or weight on file to calculate  BMI.   General Appearance:  Alert, cooperative, no distress, appears stated age  Head:  Normocephalic, without obvious abnormality, atraumatic  Eyes:  Conjunctiva clear, EOM's intact  Nose: Nares normal  Throat: Lips, tongue normal; teeth and gums normal, normal posterior oropharnyx  Neck: Supple, symmetrical  Lungs:   CTAB, Respirations unlabored, no coughing  Heart:  Appears well perfused  Extremities: No edema  Skin: Skin color, texture, turgor normal, no rashes or lesions on visualized portions of skin  Neurologic: No gross deficits     Diagnostics: PROCEDURES:  Patient received the following doses every hour: Step 1:  0.65ml - 1:1,000,000 dilution (silver  vial) Step 2:  0.62ml - 1:1,000,000 dilution (silver vial) Step 3: 0.81ml - 1:100,000 dilution (blue vial)  Step 4: 0.45ml - 1:100,000 dilution (blue vial)  Step 5: 0.31ml - 1:10,000 dilution (gold vial) Step 6: 0.62ml - 1:10,000 dilution (gold vial) Step 7: 0.90ml - 1:10,000 dilution (gold vial) Step 8: 0.4ml - 1:10,000 dilution (gold vial)  Patient was observed for 1 hour after the last dose.   Procedure started at 8:49 AM Procedure ended at 4:15 PM   ASSESSMENT/PLAN:   Patient has tolerated the rapid desensitization protocol.  Next appointment: Start at 0.78ml of 1:1000 dilution (green vial) and build up per protocol.

## 2022-07-17 ENCOUNTER — Ambulatory Visit: Payer: Managed Care, Other (non HMO) | Admitting: Internal Medicine

## 2022-07-17 ENCOUNTER — Encounter: Payer: Self-pay | Admitting: Internal Medicine

## 2022-07-17 VITALS — BP 118/78 | HR 78 | Temp 98.6°F | Resp 17

## 2022-07-17 DIAGNOSIS — J3089 Other allergic rhinitis: Secondary | ICD-10-CM

## 2022-07-22 ENCOUNTER — Encounter: Payer: Self-pay | Admitting: Internal Medicine

## 2022-07-23 ENCOUNTER — Other Ambulatory Visit: Payer: Self-pay

## 2022-07-23 MED ORDER — EPINEPHRINE 0.3 MG/0.3ML IJ SOAJ: 0.3000 mg | INTRAMUSCULAR | 2 refills | Status: AC | PRN

## 2022-07-24 ENCOUNTER — Ambulatory Visit (INDEPENDENT_AMBULATORY_CARE_PROVIDER_SITE_OTHER): Payer: Managed Care, Other (non HMO)

## 2022-07-24 DIAGNOSIS — J309 Allergic rhinitis, unspecified: Secondary | ICD-10-CM

## 2022-07-30 ENCOUNTER — Ambulatory Visit (INDEPENDENT_AMBULATORY_CARE_PROVIDER_SITE_OTHER): Payer: Managed Care, Other (non HMO)

## 2022-07-30 DIAGNOSIS — J309 Allergic rhinitis, unspecified: Secondary | ICD-10-CM

## 2022-08-06 ENCOUNTER — Ambulatory Visit (INDEPENDENT_AMBULATORY_CARE_PROVIDER_SITE_OTHER): Payer: Managed Care, Other (non HMO)

## 2022-08-06 DIAGNOSIS — J309 Allergic rhinitis, unspecified: Secondary | ICD-10-CM

## 2022-08-21 ENCOUNTER — Ambulatory Visit (INDEPENDENT_AMBULATORY_CARE_PROVIDER_SITE_OTHER): Payer: Managed Care, Other (non HMO)

## 2022-08-21 DIAGNOSIS — J309 Allergic rhinitis, unspecified: Secondary | ICD-10-CM | POA: Diagnosis not present

## 2022-08-27 ENCOUNTER — Ambulatory Visit (INDEPENDENT_AMBULATORY_CARE_PROVIDER_SITE_OTHER): Payer: Managed Care, Other (non HMO)

## 2022-08-27 DIAGNOSIS — J309 Allergic rhinitis, unspecified: Secondary | ICD-10-CM

## 2022-09-01 ENCOUNTER — Ambulatory Visit (INDEPENDENT_AMBULATORY_CARE_PROVIDER_SITE_OTHER): Payer: Managed Care, Other (non HMO)

## 2022-09-01 DIAGNOSIS — J309 Allergic rhinitis, unspecified: Secondary | ICD-10-CM | POA: Diagnosis not present

## 2022-09-10 ENCOUNTER — Ambulatory Visit (INDEPENDENT_AMBULATORY_CARE_PROVIDER_SITE_OTHER): Payer: Managed Care, Other (non HMO)

## 2022-09-10 DIAGNOSIS — J309 Allergic rhinitis, unspecified: Secondary | ICD-10-CM | POA: Diagnosis not present

## 2022-09-18 ENCOUNTER — Ambulatory Visit (INDEPENDENT_AMBULATORY_CARE_PROVIDER_SITE_OTHER): Payer: Managed Care, Other (non HMO)

## 2022-09-18 DIAGNOSIS — J309 Allergic rhinitis, unspecified: Secondary | ICD-10-CM

## 2022-09-21 ENCOUNTER — Ambulatory Visit (INDEPENDENT_AMBULATORY_CARE_PROVIDER_SITE_OTHER): Payer: Managed Care, Other (non HMO)

## 2022-09-21 DIAGNOSIS — J309 Allergic rhinitis, unspecified: Secondary | ICD-10-CM

## 2022-10-01 ENCOUNTER — Ambulatory Visit (INDEPENDENT_AMBULATORY_CARE_PROVIDER_SITE_OTHER): Payer: Managed Care, Other (non HMO)

## 2022-10-01 DIAGNOSIS — J309 Allergic rhinitis, unspecified: Secondary | ICD-10-CM | POA: Diagnosis not present

## 2022-10-04 ENCOUNTER — Other Ambulatory Visit: Payer: Self-pay | Admitting: Internal Medicine

## 2022-10-08 ENCOUNTER — Ambulatory Visit (INDEPENDENT_AMBULATORY_CARE_PROVIDER_SITE_OTHER): Payer: Managed Care, Other (non HMO)

## 2022-10-08 DIAGNOSIS — J309 Allergic rhinitis, unspecified: Secondary | ICD-10-CM

## 2022-10-12 ENCOUNTER — Ambulatory Visit (INDEPENDENT_AMBULATORY_CARE_PROVIDER_SITE_OTHER): Payer: Managed Care, Other (non HMO)

## 2022-10-12 DIAGNOSIS — J309 Allergic rhinitis, unspecified: Secondary | ICD-10-CM

## 2022-10-21 ENCOUNTER — Encounter: Payer: Self-pay | Admitting: Internal Medicine

## 2022-10-21 ENCOUNTER — Telehealth: Payer: Self-pay

## 2022-10-21 NOTE — Telephone Encounter (Signed)
Spoke with patient. She said she was doing allegra in the morning and montelukast 10 mg at night. I then told patient she could double up on her antihistamine and take all other medications and stressed for her to do the nasal rinse morning and evening and if no better at the first of the week to call the office to be seen as a work in.

## 2022-10-21 NOTE — Telephone Encounter (Signed)
Okay-have her try the meds we mentioned and if she is not getting better we can get her in early next week.

## 2022-10-21 NOTE — Telephone Encounter (Signed)
I'm sorry. I did ask patient when her symptoms started and she said Sunday evening into Monday so really it's only been 4 days.

## 2022-10-21 NOTE — Telephone Encounter (Signed)
How long has she been having symptoms?  She should take anthistamine + singulair. Continue nasal saline rinses followed by ryaltris 2 sprays in each nostril twice daily. She can double up on antihistamines if needed. If symptoms present more than 10 days, we should see her in follow-up as she may have a sinus infection.

## 2022-10-21 NOTE — Telephone Encounter (Signed)
Patient calling stating she is having facial pressure, headache,feels like she has fluid in her ears. She is also having pnd with cough. She is blowing out yellow mucus out of her nose. She is doing the nasal rinse/nasal lavage,but not every day, so I did tell patient to do her nasal rinse every day and evening now that the tree pollen is out. Patient is using her sudafed,ryaltris. She isn't using any antihistamine at this time. Her pharmacy is [facility] [address]. Please advise

## 2022-10-22 NOTE — Telephone Encounter (Signed)
Please offer her a follow-up with Chrissie either today or tomorrow to look into her current coughing and shortness of breath.  We have never discussed asthma with her, but that is something we should evaluate in person as she is on allergy injections.

## 2023-02-02 ENCOUNTER — Other Ambulatory Visit: Payer: Self-pay | Admitting: Internal Medicine

## 2023-02-03 ENCOUNTER — Other Ambulatory Visit: Payer: Self-pay

## 2023-02-03 ENCOUNTER — Encounter: Payer: Self-pay | Admitting: Internal Medicine

## 2023-02-04 ENCOUNTER — Other Ambulatory Visit: Payer: Self-pay | Admitting: Internal Medicine

## 2023-02-04 ENCOUNTER — Other Ambulatory Visit: Payer: Self-pay

## 2023-02-04 MED ORDER — MONTELUKAST SODIUM 10 MG PO TABS: ORAL_TABLET | ORAL | 0 refills | Status: DC

## 2023-02-09 NOTE — Progress Notes (Deleted)
FOLLOW UP Date of Service/Encounter:  02/09/23   Subjective:  Charlotte Frost (DOB: 24-Nov-1975) is a 47 y.o. female who [facility] in re-evaluation of the following: allergic rhinitis History obtained from: chart review and {Persons; PED relatives w/patient:19415::"patient"}.  For Review, LV was on 07/17/22  with Dr.Kruti Horacek seen for  [facility] immunotherapy . See below for summary of history and diagnostics.  Therapeutic plans/changes recommended: ** ----------------------------------------------------- Pertinent History/Diagnostics:  {Blank single:19197::"Asthma","History of Wheezing","Chronic cough","Reactive Airway Disease","***"}: *** - *** spirometry (***): ratio ***, *** FEV1 (pre), + *** FEV1 (post) Allergic Rhinitis:  significant drainage, facial pressure, congestion, which she feels progressively worsens. Occurs year-round. 4 cats in home. Has tried: cetirizine, flonase, singulair, saline spray, neil med sinus rinses;  sudafed seems most helpful, consistently taking sudafed and ibuprofen  Recurrent sinusitis-sinus surgery with Dr. Verne Spurr about 10 years ago, after this infections decreased significantly. Not requiring recurrent antibiotics, but getting prednisone 1-2 times per year. - SPT environmental panel (06/12/22): positive to dust mites, and intradermals positive to indoor/outdoor molds, cat, and dog  - RUSH started 07/17/22, last AIT injection 10/12/22. Food Intolerance:  Alcohol causes congestion. Nonspecific gluten concerns - SPT select foods (06/12/22): negative to wheat, barley, rye Contact dermatitis:  Adhesive tape H/o Penicillin Allergy: Rash in childhood. Challenge offered.  --------------------------------------------------- Today presents for follow-up. ***  Allergies as of 02/11/2023       Reactions   Adhesive [tape]    Mild rash-contact dermatitis   Neosporin [neomycin-bacitracin Zn-polymyx]    Penicillins     Rash in childhood        Medication List        Accurate as of February 09, 2023 11:40 AM. If you have any questions, ask your nurse or doctor.          cloNIDine 0.1 MG tablet Commonly known as: CATAPRES Take 0.2 mg by mouth 2 (two) times daily.   Culturelle Probiotics Chew Chew by mouth.   EPINEPHrine 0.3 mg/0.3 mL Soaj injection Commonly known as: EpiPen 2-Pak Inject 0.3 mg into the muscle as needed for anaphylaxis. Bring to your allergy injection appointments.   famotidine 20 MG tablet Commonly known as: PEPCID Take 1 tablet (20 mg total) by mouth 2 (two) times daily.   Fish Oil 1000 MG Cpdr Take by mouth.   ibuprofen 800 MG tablet Commonly known as: ADVIL Take 1 tablet (800 mg total) by mouth every 8 (eight) hours as needed.   KLONOPIN PO Take by mouth.   clonazePAM 1 MG tablet Commonly known as: KLONOPIN Take by mouth.   Magnesium 500 MG Tabs Take by mouth.   methylphenidate 36 MG CR tablet Commonly known as: CONCERTA Take 1 a day   montelukast 10 MG tablet Commonly known as: SINGULAIR TAKE 1 TABLET(10 MG) BY MOUTH AT BEDTIME   multivitamin tablet Take 1 tablet by mouth daily.   nortriptyline 50 MG capsule Commonly known as: PAMELOR Take 100 mg by mouth at bedtime.   orphenadrine 100 MG tablet Commonly known as: NORFLEX TAKE 1 TABLET BY MOUTH TWICE DAILY FOR 15 DAYS   predniSONE 20 MG tablet Commonly known as: DELTASONE Take 2 tablets Thursday morning and 2 tablets Friday morning before [facility] appt, please follow pre-meds instructions.   rosuvastatin 20 MG tablet Commonly known as: CRESTOR Take 20 mg by mouth daily.   Ryaltris 063-01 MCG/ACT Susp Generic drug: Olopatadine-Mometasone Place 2 each into the nose in the morning and at bedtime.  sertraline 100 MG tablet Commonly known as: ZOLOFT Take 100 mg by mouth 2 (two) times daily.   SUDAFED PO Take by mouth.   topiramate 50 MG tablet Commonly known as: TOPAMAX Take 50 mg by  mouth 2 (two) times daily.   Turmeric 500 MG Caps Take by mouth.   VYVANSE PO Take by mouth.   ZYRTEC PO Take by mouth.       Past Medical History:  Diagnosis Date   ADD (attention deficit disorder)    Anxiety    Depression    Sinusitis, chronic    Past Surgical History:  Procedure Laterality Date   HIP SURGERY     SINUS SURGERY WITH INSTATRAK     Otherwise, there have been no changes to her past medical history, surgical history, family history, or social history.  ROS: All others negative except as noted per HPI.   Objective:  There were no vitals taken for this visit. There is no height or weight on file to calculate BMI. Physical Exam: General Appearance:  Alert, cooperative, no distress, appears stated age  Head:  Normocephalic, without obvious abnormality, atraumatic  Eyes:  Conjunctiva clear, EOM's intact  Nose: Nares normal, {Blank multiple:19196:a:"***","hypertrophic turbinates","normal mucosa","no visible anterior polyps","septum midline"}  Throat: Lips, tongue normal; teeth and gums normal, {Blank multiple:19196:a:"***","normal posterior oropharynx","tonsils 2+","tonsils 3+","no tonsillar exudate","+ cobblestoning","surgically absent tonsils"}  Neck: Supple, symmetrical  Lungs:   {Blank multiple:19196:a:"***","clear to auscultation bilaterally","end-expiratory wheezing","wheezing throughout"}, Respirations unlabored, {Blank multiple:19196:a:"***","no coughing","intermittent dry coughing"}  Heart:  {Blank multiple:19196:a:"***","regular rate and rhythm","no murmur"}, Appears well perfused  Extremities: No edema  Skin: {Blank multiple:19196:a:"***","Skin color, texture, turgor normal","no rashes or lesions on visualized portions of skin"}  Neurologic: No gross deficits   Labs: ***  Spirometry:  Tracings reviewed. Her effort: {Blank single:19197::"Good reproducible efforts.","It was hard to get consistent efforts and there is a question as to whether this  reflects a maximal maneuver.","Poor effort, data can not be interpreted.","Variable effort-results affected","effort okay for first attempt at spirometry.","Results not reproducible due to ***"} FVC: ***L FEV1: ***L, ***% predicted FEV1/FVC ratio: ***% Interpretation: {Blank single:19197::"Spirometry consistent with mild obstructive disease","Spirometry consistent with moderate obstructive disease","Spirometry consistent with severe obstructive disease","Spirometry consistent with possible restrictive disease","Spirometry consistent with mixed obstructive and restrictive disease","Spirometry uninterpretable due to technique","Spirometry consistent with normal pattern","No overt abnormalities noted given today's efforts","Nonobstructive ratio, low FEV1","Nonobstructive ratio, low FEV1, possible restriction"}.  Please see scanned spirometry results for details.  Skin Testing: {Blank single:19197::"Select foods","Environmental allergy panel","Environmental allergy panel and select foods","Food allergy panel","None","Deferred due to recent antihistamines use","deferred due to recent reaction","Pediatric Environmental Allergy Panel","Pediatric Food Panel","Select foods and environmental allergies"}. {Blank single:19197::"Adequate positive and negative controls","Inadequate positive control-testing invalid","Adequate positive and negative controls, dermatographism present, testing difficult to interpret"}. Results discussed with patient/family.   {Blank single:19197::"Allergy testing results were read and interpreted by myself, documented by clinical staff.","Allergy testing results were read by ***,FNP, documented by clinical staff"}  Assessment/Plan   ***  Tonny Bollman, MD  [facility]

## 2023-02-11 ENCOUNTER — Ambulatory Visit: Payer: Managed Care, Other (non HMO) | Admitting: Internal Medicine

## 2023-02-24 NOTE — Progress Notes (Unsigned)
FOLLOW UP Date of Service/Encounter:  02/24/23   Subjective:  Charlotte Frost (DOB: 02/10/1976) is a 47 y.o. female who [facility] in re-evaluation of the following:  allergic rhinitis History obtained from: chart review and {Persons; PED relatives w/patient:19415::"patient"}.   For Review, LV was on 07/17/22  with Dr.Fabrizio Filip seen for  [facility] immunotherapy . See below for summary of history and diagnostics.  ----------------------------------------------------- Pertinent History/Diagnostics:  {Blank single:19197::"Asthma","History of Wheezing","Chronic cough","Reactive Airway Disease","***"}: *** - *** spirometry (***): ratio ***, *** FEV1 (pre), + *** FEV1 (post) Allergic Rhinitis:  significant drainage, facial pressure, congestion, which she feels progressively worsens. Occurs year-round. 4 cats in home. Has tried: cetirizine, flonase, singulair, saline spray, neil med sinus rinses;  sudafed seems most helpful, consistently taking sudafed and ibuprofen  Recurrent sinusitis-sinus surgery with Dr. Verne Spurr about 10 years ago, after this infections decreased significantly. Not requiring recurrent antibiotics, but getting prednisone 1-2 times per year. - SPT environmental panel (06/12/22): positive to dust mites, and intradermals positive to indoor/outdoor molds, cat, and dog  - RUSH started 07/17/22, last AIT injection 10/12/22. Food Intolerance:  Alcohol causes congestion. Nonspecific gluten concerns - SPT select foods (06/12/22): negative to wheat, barley, rye Contact dermatitis:  Adhesive tape H/o Penicillin Allergy: Rash in childhood. Challenge offered.   --------------------------------------------------- Today presents for follow-up. ***  Allergies as of 02/25/2023       Reactions   Adhesive [tape]    Mild rash-contact dermatitis   Neosporin [neomycin-bacitracin Zn-polymyx]    Penicillins    Rash in childhood         Medication List        Accurate as of February 24, 2023  1:30 PM. If you have any questions, ask your nurse or doctor.          cloNIDine 0.1 MG tablet Commonly known as: CATAPRES Take 0.2 mg by mouth 2 (two) times daily.   Culturelle Probiotics Chew Chew by mouth.   EPINEPHrine 0.3 mg/0.3 mL Soaj injection Commonly known as: EpiPen 2-Pak Inject 0.3 mg into the muscle as needed for anaphylaxis. Bring to your allergy injection appointments.   famotidine 20 MG tablet Commonly known as: PEPCID Take 1 tablet (20 mg total) by mouth 2 (two) times daily.   Fish Oil 1000 MG Cpdr Take by mouth.   ibuprofen 800 MG tablet Commonly known as: ADVIL Take 1 tablet (800 mg total) by mouth every 8 (eight) hours as needed.   KLONOPIN PO Take by mouth.   clonazePAM 1 MG tablet Commonly known as: KLONOPIN Take by mouth.   Magnesium 500 MG Tabs Take by mouth.   methylphenidate 36 MG CR tablet Commonly known as: CONCERTA Take 1 a day   montelukast 10 MG tablet Commonly known as: SINGULAIR TAKE 1 TABLET(10 MG) BY MOUTH AT BEDTIME   multivitamin tablet Take 1 tablet by mouth daily.   nortriptyline 50 MG capsule Commonly known as: PAMELOR Take 100 mg by mouth at bedtime.   orphenadrine 100 MG tablet Commonly known as: NORFLEX TAKE 1 TABLET BY MOUTH TWICE DAILY FOR 15 DAYS   predniSONE 20 MG tablet Commonly known as: DELTASONE Take 2 tablets Thursday morning and 2 tablets Friday morning before [facility] appt, please follow pre-meds instructions.   rosuvastatin 20 MG tablet Commonly known as: CRESTOR Take 20 mg by mouth daily.   Ryaltris 161-09 MCG/ACT Susp Generic drug: Olopatadine-Mometasone Place 2 each into the nose in the morning and at bedtime.  sertraline 100 MG tablet Commonly known as: ZOLOFT Take 100 mg by mouth 2 (two) times daily.   SUDAFED PO Take by mouth.   topiramate 50 MG tablet Commonly known as: TOPAMAX Take 50 mg by mouth 2 (two) times daily.    Turmeric 500 MG Caps Take by mouth.   VYVANSE PO Take by mouth.   ZYRTEC PO Take by mouth.       Past Medical History:  Diagnosis Date   ADD (attention deficit disorder)    Anxiety    Depression    Sinusitis, chronic    Past Surgical History:  Procedure Laterality Date   HIP SURGERY     SINUS SURGERY WITH INSTATRAK     Otherwise, there have been no changes to her past medical history, surgical history, family history, or social history.  ROS: All others negative except as noted per HPI.   Objective:  There were no vitals taken for this visit. There is no height or weight on file to calculate BMI. Physical Exam: General Appearance:  Alert, cooperative, no distress, appears stated age  Head:  Normocephalic, without obvious abnormality, atraumatic  Eyes:  Conjunctiva clear, EOM's intact  Nose: Nares normal, {Blank multiple:19196:a:"***","hypertrophic turbinates","normal mucosa","no visible anterior polyps","septum midline"}  Throat: Lips, tongue normal; teeth and gums normal, {Blank multiple:19196:a:"***","normal posterior oropharynx","tonsils 2+","tonsils 3+","no tonsillar exudate","+ cobblestoning","surgically absent tonsils"}  Neck: Supple, symmetrical  Lungs:   {Blank multiple:19196:a:"***","clear to auscultation bilaterally","end-expiratory wheezing","wheezing throughout"}, Respirations unlabored, {Blank multiple:19196:a:"***","no coughing","intermittent dry coughing"}  Heart:  {Blank multiple:19196:a:"***","regular rate and rhythm","no murmur"}, Appears well perfused  Extremities: No edema  Skin: {Blank multiple:19196:a:"***","Skin color, texture, turgor normal","no rashes or lesions on visualized portions of skin"}  Neurologic: No gross deficits   Labs: ***  Spirometry:  Tracings reviewed. Her effort: {Blank single:19197::"Good reproducible efforts.","It was hard to get consistent efforts and there is a question as to whether this reflects a maximal  maneuver.","Poor effort, data can not be interpreted.","Variable effort-results affected","effort okay for first attempt at spirometry.","Results not reproducible due to ***"} FVC: ***L FEV1: ***L, ***% predicted FEV1/FVC ratio: ***% Interpretation: {Blank single:19197::"Spirometry consistent with mild obstructive disease","Spirometry consistent with moderate obstructive disease","Spirometry consistent with severe obstructive disease","Spirometry consistent with possible restrictive disease","Spirometry consistent with mixed obstructive and restrictive disease","Spirometry uninterpretable due to technique","Spirometry consistent with normal pattern","No overt abnormalities noted given today's efforts","Nonobstructive ratio, low FEV1","Nonobstructive ratio, low FEV1, possible restriction"}.  Please see scanned spirometry results for details.  Skin Testing: {Blank single:19197::"Select foods","Environmental allergy panel","Environmental allergy panel and select foods","Food allergy panel","None","Deferred due to recent antihistamines use","deferred due to recent reaction","Pediatric Environmental Allergy Panel","Pediatric Food Panel","Select foods and environmental allergies"}. {Blank single:19197::"Adequate positive and negative controls","Inadequate positive control-testing invalid","Adequate positive and negative controls, dermatographism present, testing difficult to interpret"}. Results discussed with patient/family.   {Blank single:19197::"Allergy testing results were read and interpreted by myself, documented by clinical staff.","Allergy testing results were read by ***,FNP, documented by clinical staff"}  Assessment/Plan   ***  Tonny Bollman, MD  [facility]

## 2023-02-25 ENCOUNTER — Telehealth: Payer: Self-pay

## 2023-02-25 ENCOUNTER — Ambulatory Visit: Payer: Managed Care, Other (non HMO) | Admitting: Internal Medicine

## 2023-02-25 VITALS — BP 122/84 | HR 87 | Temp 97.7°F | Resp 17 | Wt 251.1 lb

## 2023-02-25 DIAGNOSIS — J3089 Other allergic rhinitis: Secondary | ICD-10-CM

## 2023-02-25 MED ORDER — MONTELUKAST SODIUM 10 MG PO TABS: ORAL_TABLET | ORAL | 1 refills | Status: DC

## 2023-02-25 NOTE — Telephone Encounter (Signed)
Pt was called to complete a telephone visit, due to pt leave during visit today.

## 2023-05-05 ENCOUNTER — Other Ambulatory Visit: Payer: Self-pay | Admitting: Internal Medicine

## 2023-08-12 ENCOUNTER — Encounter (HOSPITAL_BASED_OUTPATIENT_CLINIC_OR_DEPARTMENT_OTHER): Payer: Self-pay

## 2023-08-12 ENCOUNTER — Other Ambulatory Visit: Payer: Self-pay

## 2023-08-12 ENCOUNTER — Emergency Department (HOSPITAL_BASED_OUTPATIENT_CLINIC_OR_DEPARTMENT_OTHER)
Admission: EM | Admit: 2023-08-12 | Discharge: 2023-08-12 | Discharge status: Against Medical Advice | Payer: Worker's Compensation | Attending: Emergency Medicine | Admitting: Emergency Medicine

## 2023-08-12 DIAGNOSIS — Z7721 Contact with and (suspected) exposure to potentially hazardous body fluids: Secondary | ICD-10-CM | POA: Insufficient documentation

## 2023-08-12 DIAGNOSIS — Z5321 Procedure and treatment not carried out due to patient leaving prior to being seen by health care provider: Secondary | ICD-10-CM | POA: Insufficient documentation

## 2023-08-12 HISTORY — DX: Pure hypercholesterolemia, unspecified: E78.00

## 2023-08-12 LAB — HEPATITIS PANEL, ACUTE
HCV Ab: NONREACTIVE
Hep A IgM: NONREACTIVE
Hep B C IgM: NONREACTIVE
Hepatitis B Surface Ag: NONREACTIVE

## 2023-08-12 LAB — RAPID HIV SCREEN (HIV 1/2 AB+AG)
HIV 1/2 Antibodies: NONREACTIVE
HIV-1 P24 Antigen - HIV24: NONREACTIVE

## 2023-08-12 LAB — HEPATITIS B SURFACE ANTIGEN: Hepatitis B Surface Ag: NONREACTIVE

## 2023-08-12 NOTE — ED Notes (Signed)
Pt not in the department. Searched entire ED> Pt not here anymore.

## 2023-08-12 NOTE — ED Triage Notes (Signed)
The patient had a needle stick at work today.

## 2023-08-25 ENCOUNTER — Other Ambulatory Visit: Payer: Self-pay

## 2023-08-25 ENCOUNTER — Encounter: Payer: Self-pay | Admitting: Internal Medicine

## 2023-08-25 MED ORDER — RYALTRIS 665-25 MCG/ACT NA SUSP: 2.0000 | Freq: Two times a day (BID) | NASAL | 3 refills | Status: AC

## 2023-11-14 ENCOUNTER — Other Ambulatory Visit: Payer: Self-pay | Admitting: Internal Medicine

## 2024-01-17 ENCOUNTER — Other Ambulatory Visit (HOSPITAL_BASED_OUTPATIENT_CLINIC_OR_DEPARTMENT_OTHER): Payer: Self-pay

## 2024-01-17 MED ORDER — LISDEXAMFETAMINE DIMESYLATE 50 MG PO CAPS: 50.0000 mg | ORAL_CAPSULE | Freq: Every day | ORAL | 0 refills | Status: DC

## 2024-01-17 MED ORDER — SERTRALINE HCL 100 MG PO TABS
100.0000 mg | ORAL_TABLET | Freq: Every day | ORAL | 5 refills | Status: DC
  Filled 2024-01-17: qty 60, 30d supply, fill #0
  Filled 2024-02-15: qty 60, 30d supply, fill #1
  Filled 2024-03-10: qty 60, 30d supply, fill #2
  Filled 2024-04-26: qty 60, 30d supply, fill #3
  Filled 2024-05-23: qty 60, 30d supply, fill #4

## 2024-01-17 MED ORDER — LISDEXAMFETAMINE DIMESYLATE 50 MG PO CAPS
50.0000 mg | ORAL_CAPSULE | Freq: Every day | ORAL | 0 refills | Status: AC
  Filled 2024-01-17: qty 30, 30d supply, fill #0

## 2024-01-17 MED ORDER — CLONIDINE HCL 0.1 MG PO TABS
0.2000 mg | ORAL_TABLET | Freq: Two times a day (BID) | ORAL | 2 refills | Status: DC
  Filled 2024-01-17: qty 120, 30d supply, fill #0
  Filled 2024-02-15: qty 120, 30d supply, fill #1
  Filled 2024-03-10: qty 120, 30d supply, fill #2

## 2024-01-17 MED ORDER — QUETIAPINE FUMARATE 25 MG PO TABS
50.0000 mg | ORAL_TABLET | Freq: Every day | ORAL | 5 refills | Status: DC
  Filled 2024-01-17: qty 60, 30d supply, fill #0
  Filled 2024-02-15: qty 60, 30d supply, fill #1
  Filled 2024-03-10: qty 60, 30d supply, fill #2
  Filled 2024-04-26: qty 60, 30d supply, fill #3
  Filled 2024-05-23: qty 60, 30d supply, fill #4

## 2024-01-17 MED ORDER — NORTRIPTYLINE HCL 75 MG PO CAPS
150.0000 mg | ORAL_CAPSULE | Freq: Every day | ORAL | 2 refills | Status: DC
  Filled 2024-01-17 – 2024-01-20 (×4): qty 60, 30d supply, fill #0
  Filled 2024-02-15: qty 60, 30d supply, fill #1
  Filled 2024-03-10: qty 60, 30d supply, fill #2

## 2024-01-17 MED ORDER — CLONAZEPAM 1 MG PO TABS
ORAL_TABLET | ORAL | 5 refills | Status: DC
  Filled 2024-01-17 – 2024-02-15 (×2): qty 120, 30d supply, fill #0
  Filled 2024-03-10 – 2024-03-18 (×2): qty 120, 30d supply, fill #1
  Filled 2024-04-27: qty 120, 30d supply, fill #2
  Filled 2024-05-23 – 2024-05-25 (×2): qty 120, 30d supply, fill #3

## 2024-01-20 ENCOUNTER — Other Ambulatory Visit (HOSPITAL_BASED_OUTPATIENT_CLINIC_OR_DEPARTMENT_OTHER): Payer: Self-pay

## 2024-02-02 ENCOUNTER — Other Ambulatory Visit (HOSPITAL_BASED_OUTPATIENT_CLINIC_OR_DEPARTMENT_OTHER): Payer: Self-pay

## 2024-02-02 MED ORDER — NITROFURANTOIN MONOHYD MACRO 100 MG PO CAPS
100.0000 mg | ORAL_CAPSULE | Freq: Two times a day (BID) | ORAL | 0 refills | Status: AC
  Filled 2024-02-02: qty 10, 5d supply, fill #0

## 2024-02-04 ENCOUNTER — Other Ambulatory Visit (HOSPITAL_BASED_OUTPATIENT_CLINIC_OR_DEPARTMENT_OTHER): Payer: Self-pay

## 2024-02-04 MED ORDER — ZEPBOUND 2.5 MG/0.5ML ~~LOC~~ SOAJ
2.5000 mg | SUBCUTANEOUS | 2 refills | Status: DC
  Filled 2024-02-04 – 2024-02-15 (×2): qty 2, 28d supply, fill #0
  Filled 2024-03-10: qty 2, 28d supply, fill #1
  Filled 2024-04-07 – 2024-04-14 (×2): qty 2, 28d supply, fill #2

## 2024-02-14 ENCOUNTER — Other Ambulatory Visit (HOSPITAL_BASED_OUTPATIENT_CLINIC_OR_DEPARTMENT_OTHER): Payer: Self-pay

## 2024-02-15 ENCOUNTER — Other Ambulatory Visit: Payer: Self-pay

## 2024-02-15 ENCOUNTER — Other Ambulatory Visit (HOSPITAL_BASED_OUTPATIENT_CLINIC_OR_DEPARTMENT_OTHER): Payer: Self-pay

## 2024-03-10 ENCOUNTER — Other Ambulatory Visit (HOSPITAL_BASED_OUTPATIENT_CLINIC_OR_DEPARTMENT_OTHER): Payer: Self-pay

## 2024-03-10 ENCOUNTER — Other Ambulatory Visit: Payer: Self-pay

## 2024-03-20 ENCOUNTER — Other Ambulatory Visit (HOSPITAL_BASED_OUTPATIENT_CLINIC_OR_DEPARTMENT_OTHER): Payer: Self-pay

## 2024-03-20 ENCOUNTER — Other Ambulatory Visit: Payer: Self-pay

## 2024-03-20 MED ORDER — ROSUVASTATIN CALCIUM 40 MG PO TABS
40.0000 mg | ORAL_TABLET | Freq: Every day | ORAL | 0 refills | Status: AC
  Filled 2024-03-20: qty 30, 30d supply, fill #0
  Filled 2024-04-26: qty 30, 30d supply, fill #1
  Filled 2024-05-23: qty 30, 30d supply, fill #2

## 2024-03-24 ENCOUNTER — Other Ambulatory Visit (HOSPITAL_BASED_OUTPATIENT_CLINIC_OR_DEPARTMENT_OTHER): Payer: Self-pay

## 2024-04-03 ENCOUNTER — Other Ambulatory Visit (HOSPITAL_BASED_OUTPATIENT_CLINIC_OR_DEPARTMENT_OTHER): Payer: Self-pay

## 2024-04-03 MED ORDER — LISDEXAMFETAMINE DIMESYLATE 50 MG PO CAPS
50.0000 mg | ORAL_CAPSULE | Freq: Every day | ORAL | 0 refills | Status: DC
  Filled 2024-04-03 – 2024-04-14 (×2): qty 30, 30d supply, fill #0

## 2024-04-03 MED ORDER — LISDEXAMFETAMINE DIMESYLATE 50 MG PO CAPS: 50.0000 mg | ORAL_CAPSULE | Freq: Every day | ORAL | 0 refills | Status: DC

## 2024-04-03 MED ORDER — CLONIDINE HCL 0.1 MG PO TABS
0.2000 mg | ORAL_TABLET | Freq: Two times a day (BID) | ORAL | 2 refills | Status: DC
  Filled 2024-04-03 – 2024-04-14 (×2): qty 120, 30d supply, fill #0
  Filled 2024-05-11: qty 120, 30d supply, fill #1

## 2024-04-13 ENCOUNTER — Other Ambulatory Visit (HOSPITAL_BASED_OUTPATIENT_CLINIC_OR_DEPARTMENT_OTHER): Payer: Self-pay

## 2024-04-14 ENCOUNTER — Other Ambulatory Visit (HOSPITAL_BASED_OUTPATIENT_CLINIC_OR_DEPARTMENT_OTHER): Payer: Self-pay

## 2024-04-18 ENCOUNTER — Other Ambulatory Visit (HOSPITAL_BASED_OUTPATIENT_CLINIC_OR_DEPARTMENT_OTHER): Payer: Self-pay

## 2024-04-26 ENCOUNTER — Other Ambulatory Visit (HOSPITAL_BASED_OUTPATIENT_CLINIC_OR_DEPARTMENT_OTHER): Payer: Self-pay

## 2024-04-27 ENCOUNTER — Other Ambulatory Visit: Payer: Self-pay

## 2024-04-27 ENCOUNTER — Other Ambulatory Visit (HOSPITAL_BASED_OUTPATIENT_CLINIC_OR_DEPARTMENT_OTHER): Payer: Self-pay

## 2024-04-28 ENCOUNTER — Encounter (HOSPITAL_BASED_OUTPATIENT_CLINIC_OR_DEPARTMENT_OTHER): Payer: Self-pay

## 2024-04-28 ENCOUNTER — Other Ambulatory Visit (HOSPITAL_BASED_OUTPATIENT_CLINIC_OR_DEPARTMENT_OTHER): Payer: Self-pay

## 2024-05-07 ENCOUNTER — Other Ambulatory Visit (HOSPITAL_BASED_OUTPATIENT_CLINIC_OR_DEPARTMENT_OTHER): Payer: Self-pay

## 2024-05-08 ENCOUNTER — Other Ambulatory Visit (HOSPITAL_BASED_OUTPATIENT_CLINIC_OR_DEPARTMENT_OTHER): Payer: Self-pay

## 2024-05-11 ENCOUNTER — Other Ambulatory Visit (HOSPITAL_BASED_OUTPATIENT_CLINIC_OR_DEPARTMENT_OTHER): Payer: Self-pay

## 2024-05-16 ENCOUNTER — Other Ambulatory Visit (HOSPITAL_BASED_OUTPATIENT_CLINIC_OR_DEPARTMENT_OTHER): Payer: Self-pay

## 2024-05-23 ENCOUNTER — Other Ambulatory Visit (HOSPITAL_BASED_OUTPATIENT_CLINIC_OR_DEPARTMENT_OTHER): Payer: Self-pay

## 2024-05-24 ENCOUNTER — Other Ambulatory Visit: Payer: Self-pay

## 2024-05-25 ENCOUNTER — Other Ambulatory Visit: Payer: Self-pay

## 2024-05-25 ENCOUNTER — Other Ambulatory Visit (HOSPITAL_BASED_OUTPATIENT_CLINIC_OR_DEPARTMENT_OTHER): Payer: Self-pay

## 2024-06-02 ENCOUNTER — Other Ambulatory Visit: Payer: Self-pay

## 2024-06-05 ENCOUNTER — Other Ambulatory Visit (HOSPITAL_BASED_OUTPATIENT_CLINIC_OR_DEPARTMENT_OTHER): Payer: Self-pay

## 2024-06-08 ENCOUNTER — Other Ambulatory Visit (HOSPITAL_BASED_OUTPATIENT_CLINIC_OR_DEPARTMENT_OTHER): Payer: Self-pay

## 2024-06-12 ENCOUNTER — Other Ambulatory Visit (HOSPITAL_BASED_OUTPATIENT_CLINIC_OR_DEPARTMENT_OTHER): Payer: Self-pay

## 2024-06-12 MED ORDER — ROSUVASTATIN CALCIUM 40 MG PO TABS
40.0000 mg | ORAL_TABLET | Freq: Every day | ORAL | 0 refills | Status: DC
  Filled 2024-06-12: qty 30, 30d supply, fill #0

## 2024-06-13 ENCOUNTER — Other Ambulatory Visit (HOSPITAL_BASED_OUTPATIENT_CLINIC_OR_DEPARTMENT_OTHER): Payer: Self-pay

## 2024-07-14 ENCOUNTER — Other Ambulatory Visit (HOSPITAL_BASED_OUTPATIENT_CLINIC_OR_DEPARTMENT_OTHER): Payer: Self-pay
